# Patient Record
Sex: Male | Born: 1960 | Race: White | Hispanic: No | Marital: Single | Smoking: Current every day smoker
Health system: Southern US, Community
[De-identification: ages and names within clinical notes are randomized; demographics above are authoritative.]

## PROBLEM LIST (undated history)

## (undated) DIAGNOSIS — M549 Dorsalgia, unspecified: Secondary | ICD-10-CM

## (undated) DIAGNOSIS — G8929 Other chronic pain: Secondary | ICD-10-CM

## (undated) DIAGNOSIS — I10 Essential (primary) hypertension: Secondary | ICD-10-CM

---

## 2002-01-06 ENCOUNTER — Inpatient Hospital Stay (HOSPITAL_COMMUNITY): Admission: EM | Admit: 2002-01-06 | Discharge: 2002-01-14 | Payer: Self-pay | Admitting: Radiation Oncology

## 2009-10-30 ENCOUNTER — Emergency Department: Payer: Self-pay | Admitting: Emergency Medicine

## 2015-09-17 ENCOUNTER — Emergency Department (HOSPITAL_COMMUNITY): Payer: Commercial Managed Care - PPO

## 2015-09-17 ENCOUNTER — Encounter (HOSPITAL_COMMUNITY): Payer: Self-pay

## 2015-09-17 DIAGNOSIS — R079 Chest pain, unspecified: Secondary | ICD-10-CM | POA: Diagnosis present

## 2015-09-17 DIAGNOSIS — R112 Nausea with vomiting, unspecified: Secondary | ICD-10-CM | POA: Insufficient documentation

## 2015-09-17 DIAGNOSIS — R918 Other nonspecific abnormal finding of lung field: Secondary | ICD-10-CM | POA: Diagnosis not present

## 2015-09-17 DIAGNOSIS — I10 Essential (primary) hypertension: Secondary | ICD-10-CM | POA: Diagnosis not present

## 2015-09-17 DIAGNOSIS — J159 Unspecified bacterial pneumonia: Secondary | ICD-10-CM | POA: Diagnosis not present

## 2015-09-17 DIAGNOSIS — Z880 Allergy status to penicillin: Secondary | ICD-10-CM | POA: Diagnosis not present

## 2015-09-17 DIAGNOSIS — G8929 Other chronic pain: Secondary | ICD-10-CM | POA: Insufficient documentation

## 2015-09-17 DIAGNOSIS — F17210 Nicotine dependence, cigarettes, uncomplicated: Secondary | ICD-10-CM | POA: Diagnosis not present

## 2015-09-17 LAB — CBC
HCT: 43.5 % (ref 39.0–52.0)
Hemoglobin: 14.4 g/dL (ref 13.0–17.0)
MCH: 32 pg (ref 26.0–34.0)
MCHC: 33.1 g/dL (ref 30.0–36.0)
MCV: 96.7 fL (ref 78.0–100.0)
PLATELETS: 313 10*3/uL (ref 150–400)
RBC: 4.5 MIL/uL (ref 4.22–5.81)
RDW: 13.9 % (ref 11.5–15.5)
WBC: 12.5 10*3/uL — AB (ref 4.0–10.5)

## 2015-09-17 LAB — I-STAT TROPONIN, ED: TROPONIN I, POC: 0 ng/mL (ref 0.00–0.08)

## 2015-09-17 LAB — BASIC METABOLIC PANEL
ANION GAP: 13 (ref 5–15)
BUN: 21 mg/dL — AB (ref 6–20)
CALCIUM: 9.5 mg/dL (ref 8.9–10.3)
CO2: 33 mmol/L — ABNORMAL HIGH (ref 22–32)
CREATININE: 1.1 mg/dL (ref 0.61–1.24)
Chloride: 94 mmol/L — ABNORMAL LOW (ref 101–111)
Glucose, Bld: 107 mg/dL — ABNORMAL HIGH (ref 65–99)
Potassium: 3.5 mmol/L (ref 3.5–5.1)
Sodium: 140 mmol/L (ref 135–145)

## 2015-09-17 NOTE — ED Notes (Signed)
7 weeks ago pt had flu symptoms since then pt has been fatigued, substernal chest pain, pain underneath left breast and LUQ abd pain.  Pt has been vomiting off and on,last vomiting 3 days ago.

## 2015-09-18 ENCOUNTER — Emergency Department (HOSPITAL_COMMUNITY)
Admission: EM | Admit: 2015-09-18 | Discharge: 2015-09-18 | Disposition: A | Discharge status: Home / Self Care | Payer: Commercial Managed Care - PPO | Attending: Emergency Medicine | Admitting: Emergency Medicine

## 2015-09-18 DIAGNOSIS — J189 Pneumonia, unspecified organism: Secondary | ICD-10-CM

## 2015-09-18 DIAGNOSIS — R9389 Abnormal findings on diagnostic imaging of other specified body structures: Secondary | ICD-10-CM

## 2015-09-18 HISTORY — DX: Other chronic pain: G89.29

## 2015-09-18 HISTORY — DX: Essential (primary) hypertension: I10

## 2015-09-18 HISTORY — DX: Dorsalgia, unspecified: M54.9

## 2015-09-18 MED ORDER — ONDANSETRON 4 MG PO TBDP: 4.0000 mg | ORAL_TABLET | Freq: Three times a day (TID) | ORAL | Status: AC | PRN

## 2015-09-18 MED ORDER — LEVOFLOXACIN 750 MG PO TABS
750.0000 mg | ORAL_TABLET | Freq: Once | ORAL | Status: AC
  Administered 2015-09-18: 750 mg via ORAL
  Filled 2015-09-18: qty 1

## 2015-09-18 MED ORDER — LEVOFLOXACIN 500 MG PO TABS: 500.0000 mg | ORAL_TABLET | Freq: Every day | ORAL | Status: AC

## 2015-09-18 NOTE — ED Provider Notes (Signed)
CSN: [id number]     Arrival date & time 09/17/15  2251 History   By signing my name below, I, Bethel BornBritney McCollum, attest that this documentation has been prepared under the direction and in the presence of Rolland PorterMark Quamel Fitzmaurice, MD. Electronically Signed: Bethel BornBritney McCollum, ED Scribe. 09/18/2015. 3:52 AM   Chief Complaint  Patient presents with  . Chest Pain    The history is provided by the patient. No language interpreter was used.   Angel Simmondsaul Dwayne Mcglocklin is a 55 y.o. male with history of HTN who presents to the Emergency Department complaining of sharp, 5/10 in severity,  substernal chest pain with onset 7 weeks ago. Pt states that 7 weeks ago he had the flu with a cough, SOB, chest pain, fever, abdominal pain, nausea, and vomiting.  The chest pain and cough have lingered and he has continued to vomit over the last 3-4 weeks. He reports approximately 10 episodes of foamy, non-bloody emesis since initial onset. Associated symptoms include chills. He is a heavy drinker and is concerned abut his liver because his eyes are red. He last used alcohol 4 days ago. He smokes 1 PPD. Pt is allergic to penicillin.   Past Medical History  Diagnosis Date  . Hypertension   . Chronic back pain    History reviewed. No pertinent past surgical history. History reviewed. No pertinent family history. Social History  Substance Use Topics  . Smoking status: Current Every Day Smoker -- 1.00 packs/day    Types: Cigarettes  . Smokeless tobacco: None  . Alcohol Use: Yes    Review of Systems  Constitutional: Positive for chills. Negative for fever, diaphoresis, appetite change and fatigue.  HENT: Negative for mouth sores, sore throat and trouble swallowing.   Eyes: Positive for redness. Negative for visual disturbance.  Respiratory: Positive for cough. Negative for chest tightness, shortness of breath and wheezing.   Cardiovascular: Positive for chest pain.  Gastrointestinal: Positive for nausea and vomiting. Negative for  abdominal pain, diarrhea and abdominal distention.  Endocrine: Negative for polydipsia, polyphagia and polyuria.  Genitourinary: Negative for dysuria, frequency and hematuria.  Musculoskeletal: Negative for gait problem.  Skin: Negative for color change, pallor and rash.  Neurological: Negative for dizziness, syncope, light-headedness and headaches.  Hematological: Does not bruise/bleed easily.  Psychiatric/Behavioral: Negative for behavioral problems and confusion.      Allergies  Penicillins  Home Medications   Prior to Admission medications   Medication Sig Start Date End Date Taking? Authorizing Provider  levofloxacin (LEVAQUIN) 500 MG tablet Take 1 tablet (500 mg total) by mouth daily. 09/18/15   Rolland PorterMark Kervin Bones, MD  ondansetron (ZOFRAN ODT) 4 MG disintegrating tablet Take 1 tablet (4 mg total) by mouth every 8 (eight) hours as needed for nausea. 09/18/15   Rolland PorterMark Jailin Moomaw, MD   BP 137/74 mmHg  Pulse 62  Temp(Src) 97.8 F (36.6 C) (Oral)  Resp 16  Ht 5\' 11"  (1.803 m)  Wt 201 lb 9.6 oz (91.445 kg)  BMI 28.13 kg/m2  SpO2 99% Physical Exam  Constitutional: He is oriented to person, place, and time. He appears well-developed and well-nourished. No distress.  HENT:  Head: Normocephalic.  Eyes: Conjunctivae are normal. Pupils are equal, round, and reactive to light. No scleral icterus.  Neck: Normal range of motion. Neck supple. No thyromegaly present.  Cardiovascular: Normal rate and regular rhythm.  Exam reveals no gallop and no friction rub.   No murmur heard. Pulmonary/Chest: Effort normal. No respiratory distress. He has no wheezes. He has  rales.  Crackles at the right lateral mid lung   Abdominal: Soft. Bowel sounds are normal. He exhibits no distension. There is no tenderness. There is no rebound.  Musculoskeletal: Normal range of motion.  Neurological: He is alert and oriented to person, place, and time.  Skin: Skin is warm and dry. No rash noted.  Psychiatric: He has a normal  mood and affect. His behavior is normal.    ED Course  Procedures (including critical care time) DIAGNOSTIC STUDIES: Oxygen Saturation is 99% on RA,  normal by my interpretation.    COORDINATION OF CARE: 3:49 AM Discussed treatment plan which includes lab work, CXR, EKG, and abx with pt at bedside and pt agreed to plan.  Labs Review Labs Reviewed  BASIC METABOLIC PANEL - Abnormal; Notable for the following:    Chloride 94 (*)    CO2 33 (*)    Glucose, Bld 107 (*)    BUN 21 (*)    All other components within normal limits  CBC - Abnormal; Notable for the following:    WBC 12.5 (*)    All other components within normal limits  Rosezena Sensor, ED    Imaging Review Dg Chest 2 View  09/17/2015  CLINICAL DATA:  Left lower chest pain. Cough. Intermittent symptoms for 7 weeks. EXAM: CHEST  2 VIEW COMPARISON:  None. FINDINGS: Ill-defined opacities at the right lung base, not well seen on the lateral view, however likely localizing to the right middle lobe. Questionable nodular opacities in the left lung base. While 1 of these opacities Ing reflect nipple shadow, additional nodular density Difiore be a pulmonary nodule or bone island within the anterior rib. The heart size and mediastinal contours are normal. No pulmonary edema, pleural effusion or pneumothorax. There is degenerative change in the spine. IMPRESSION: Ill-defined right basilar opacities, likely localizing to the right middle lobe, Laster reflect pneumonia in the appropriate clinical setting. There are questionable pulmonary nodules in the left lung base. Followup PA and lateral chest X-ray is recommended in 3-4 weeks following trial of antibiotic therapy to ensure resolution and exclude underlying malignancy, should left sided nodular densities persist, chest CT Mahaffy be warranted. Electronically Signed   By: Rubye Oaks M.D.   On: 09/17/2015 23:49   I have personally reviewed and evaluated these images and lab results as part of my  medical decision-making.   EKG Interpretation None      MDM   Final diagnoses:  Community acquired pneumonia  Abnormal chest x-ray    Patient clinically has crackles suggestive of right middle to lower lobe pneumonia. He is not hypoxemic, tachycardic.  He does not appear distressed. Discussed with him the importance of follow-up chest x-ray to ensure all of the above changes have resolved. I suggested he follow-up with his primary care physician in 1 month's time. Pressures for Levaquin. First dose here. No alcohol. No tobacco.   I personally performed the services described in this documentation, which was scribed in my presence. The recorded information has been reviewed and is accurate.    Rolland Porter, MD 09/18/15 [phone]

## 2015-09-18 NOTE — ED Notes (Signed)
Pt verbalized understanding of discharge instructions and medication instructions. Vital signs stable at discharge.

## 2015-09-18 NOTE — Discharge Instructions (Signed)
Your x-ray is abnormal. You have a right middle lobe pneumonia. Call your physician to arrange for a follow-up appointment for a repeat x-ray to ensure that the pneumonia has resolved, and your x-ray has cleared   Community-Acquired Pneumonia, Adult Pneumonia is an infection of the lungs. There are different types of pneumonia. One type can develop while a person is in a hospital. A different type, called community-acquired pneumonia, develops in people who are not, or have not recently been, in the hospital or other health care facility.  CAUSES Pneumonia Meenach be caused by bacteria, viruses, or funguses. Community-acquired pneumonia is often caused by Streptococcus pneumonia bacteria. These bacteria are often passed from one person to another by breathing in droplets from the cough or sneeze of an infected person. RISK FACTORS The condition is more likely to develop in:  People who havechronic diseases, such as chronic obstructive pulmonary disease (COPD), asthma, congestive heart failure, cystic fibrosis, diabetes, or kidney disease.  People who haveearly-stage or late-stage HIV.  People who havesickle cell disease.  People who havehad their spleen removed (splenectomy).  People who havepoor Administratordental hygiene.  People who havemedical conditions that increase the risk of breathing in (aspirating) secretions their own mouth and nose.   People who havea weakened immune system (immunocompromised).  People who smoke.  People whotravel to areas where pneumonia-causing germs commonly exist.  People whoare around animal habitats or animals that have pneumonia-causing germs, including birds, bats, rabbits, cats, and farm animals. SYMPTOMS Symptoms of this condition include:  Adry cough.  A wet (productive) cough.  Fever.  Sweating.  Chest pain, especially when breathing deeply or coughing.  Rapid breathing or difficulty breathing.  Shortness of breath.  Shaking  chills.  Fatigue.  Muscle aches. DIAGNOSIS Your health care provider will take a medical history and perform a physical exam. You Golberg also have other tests, including:  Imaging studies of your chest, including X-rays.  Tests to check your blood oxygen level and other blood gases.  Other tests on blood, mucus (sputum), fluid around your lungs (pleural fluid), and urine. If your pneumonia is severe, other tests Mauzy be done to identify the specific cause of your illness. TREATMENT The type of treatment that you receive depends on many factors, such as the cause of your pneumonia, the medicines you take, and other medical conditions that you have. For most adults, treatment and recovery from pneumonia Derner occur at home. In some cases, treatment must happen in a hospital. Treatment Raker include:  Antibiotic medicines, if the pneumonia was caused by bacteria.  Antiviral medicines, if the pneumonia was caused by a virus.  Medicines that are given by mouth or through an IV tube.  Oxygen.  Respiratory therapy. Although rare, treating severe pneumonia Whitebread include:  Mechanical ventilation. This is done if you are not breathing well on your own and you cannot maintain a safe blood oxygen level.  Thoracentesis. This procedureremoves fluid around one lung or both lungs to help you breathe better. HOME CARE INSTRUCTIONS  Take over-the-counter and prescription medicines only as told by your health care provider.  Only takecough medicine if you are losing sleep. Understand that cough medicine can prevent your body's natural ability to remove mucus from your lungs.  If you were prescribed an antibiotic medicine, take it as told by your health care provider. Do not stop taking the antibiotic even if you start to feel better.  Sleep in a semi-upright position at night. Try sleeping in a reclining chair,  or place a few pillows under your head.  Do not use tobacco products, including cigarettes,  chewing tobacco, and e-cigarettes. If you need help quitting, ask your health care provider.  Drink enough water to keep your urine clear or pale yellow. This will help to thin out mucus secretions in your lungs. PREVENTION There are ways that you can decrease your risk of developing community-acquired pneumonia. Consider getting a pneumococcal vaccine if:  You are older than 55 years of age.  You are older than 55 years of age and are undergoing cancer treatment, have chronic lung disease, or have other medical conditions that affect your immune system. Ask your health care provider if this applies to you. There are different types and schedules of pneumococcal vaccines. Ask your health care provider which vaccination option is best for you. You Nicks also prevent community-acquired pneumonia if you take these actions:  Get an influenza vaccine every year. Ask your health care provider which type of influenza vaccine is best for you.  Go to the dentist on a regular basis.  Wash your hands often. Use hand sanitizer if soap and water are not available. SEEK MEDICAL CARE IF:  You have a fever.  You are losing sleep because you cannot control your cough with cough medicine. SEEK IMMEDIATE MEDICAL CARE IF:  You have worsening shortness of breath.  You have increased chest pain.  Your sickness becomes worse, especially if you are an older adult or have a weakened immune system.  You cough up blood.   This information is not intended to replace advice given to you by your health care provider. Make sure you discuss any questions you have with your health care provider.   Document Released: 06/01/2005 Document Revised: 02/20/2015 Document Reviewed: 09/26/2014 Elsevier Interactive Patient Education [employer]

## 2015-09-18 NOTE — ED Notes (Signed)
Family at bedside. 

## 2017-01-17 IMAGING — CR DG CHEST 2V
2 series · 2 of 2 positions shown · non-contrast
Comparison: None.

CLINICAL DATA: Left lower chest pain. Cough. Intermittent symptoms
for 7 weeks.

EXAM:
CHEST  2 VIEW

[chest pa]
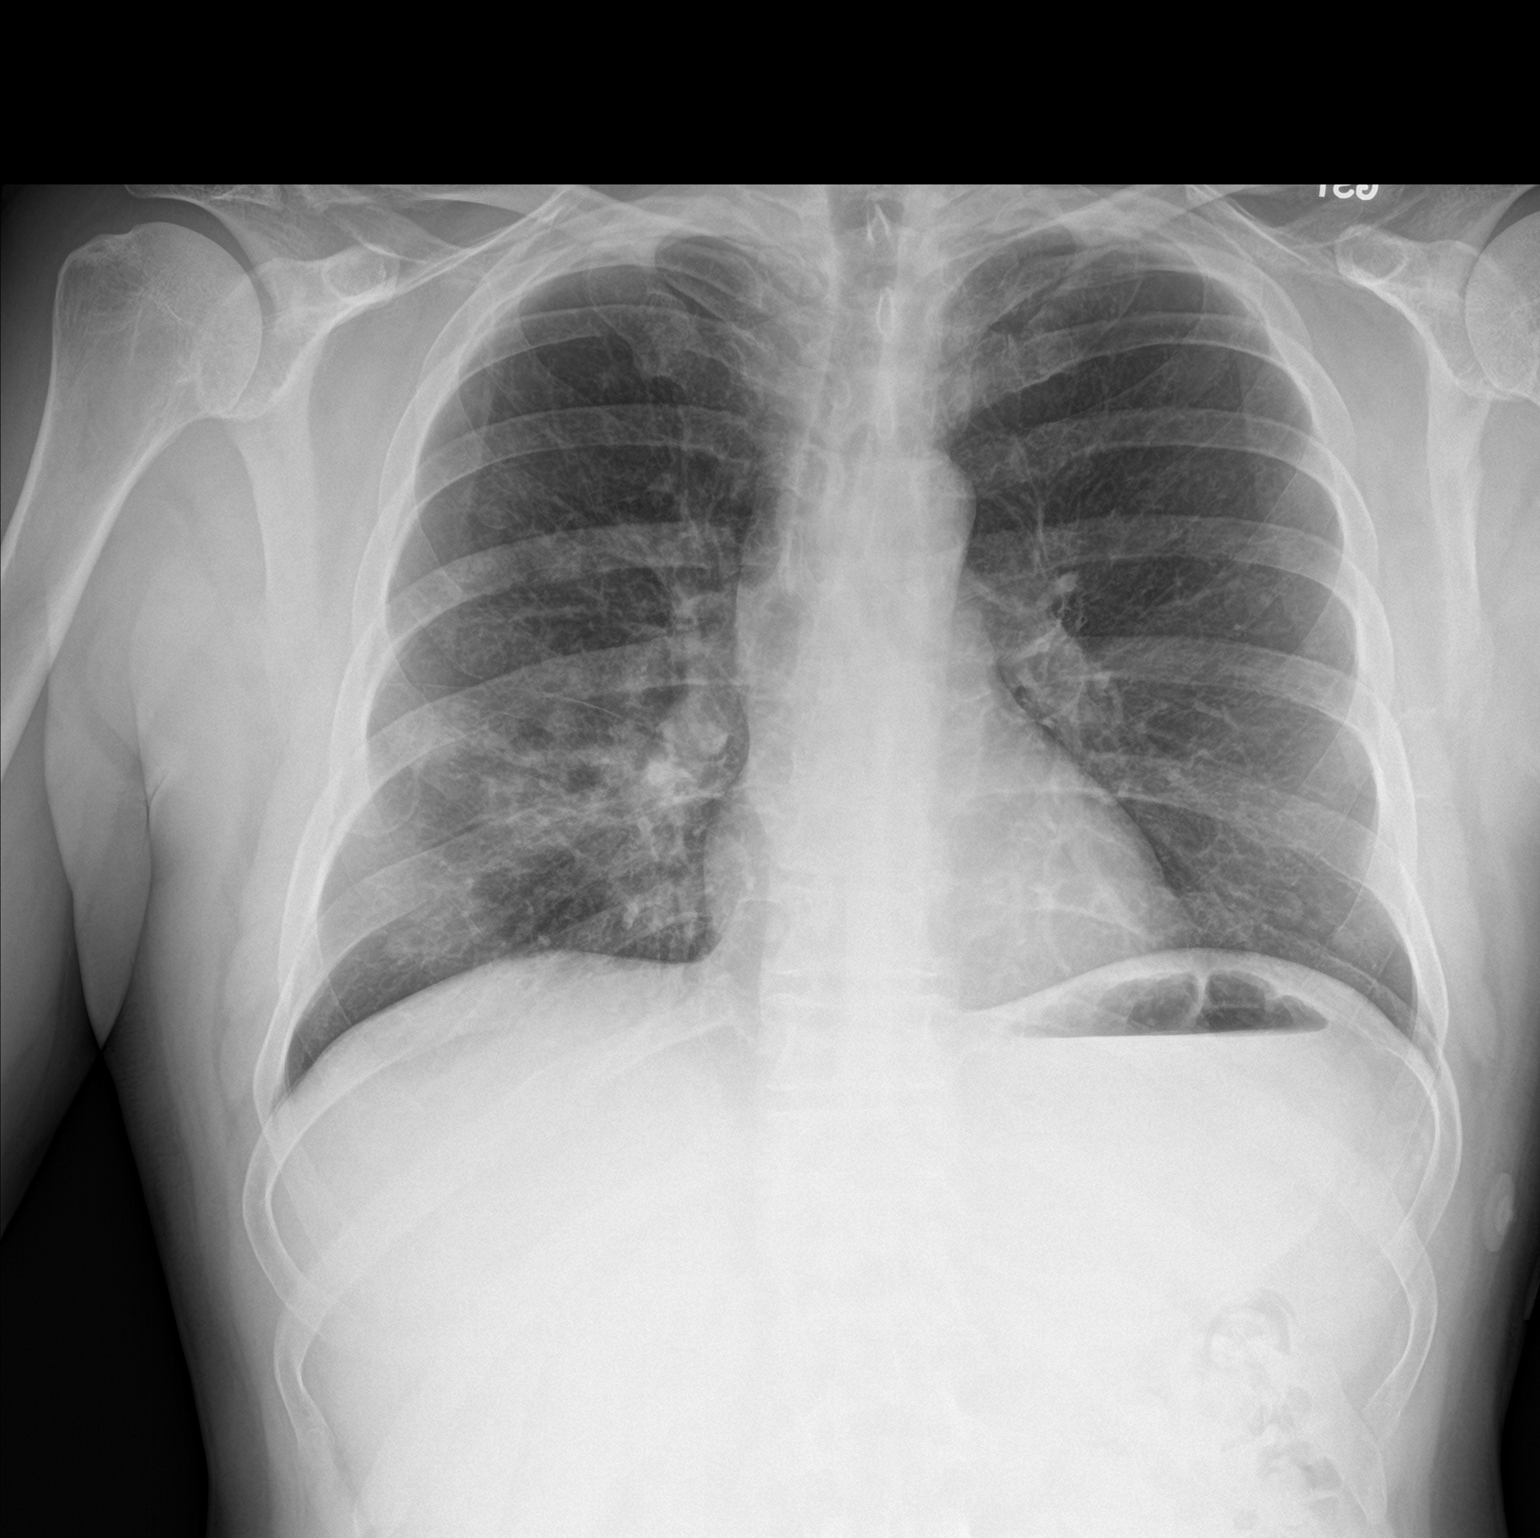

[chest lat]
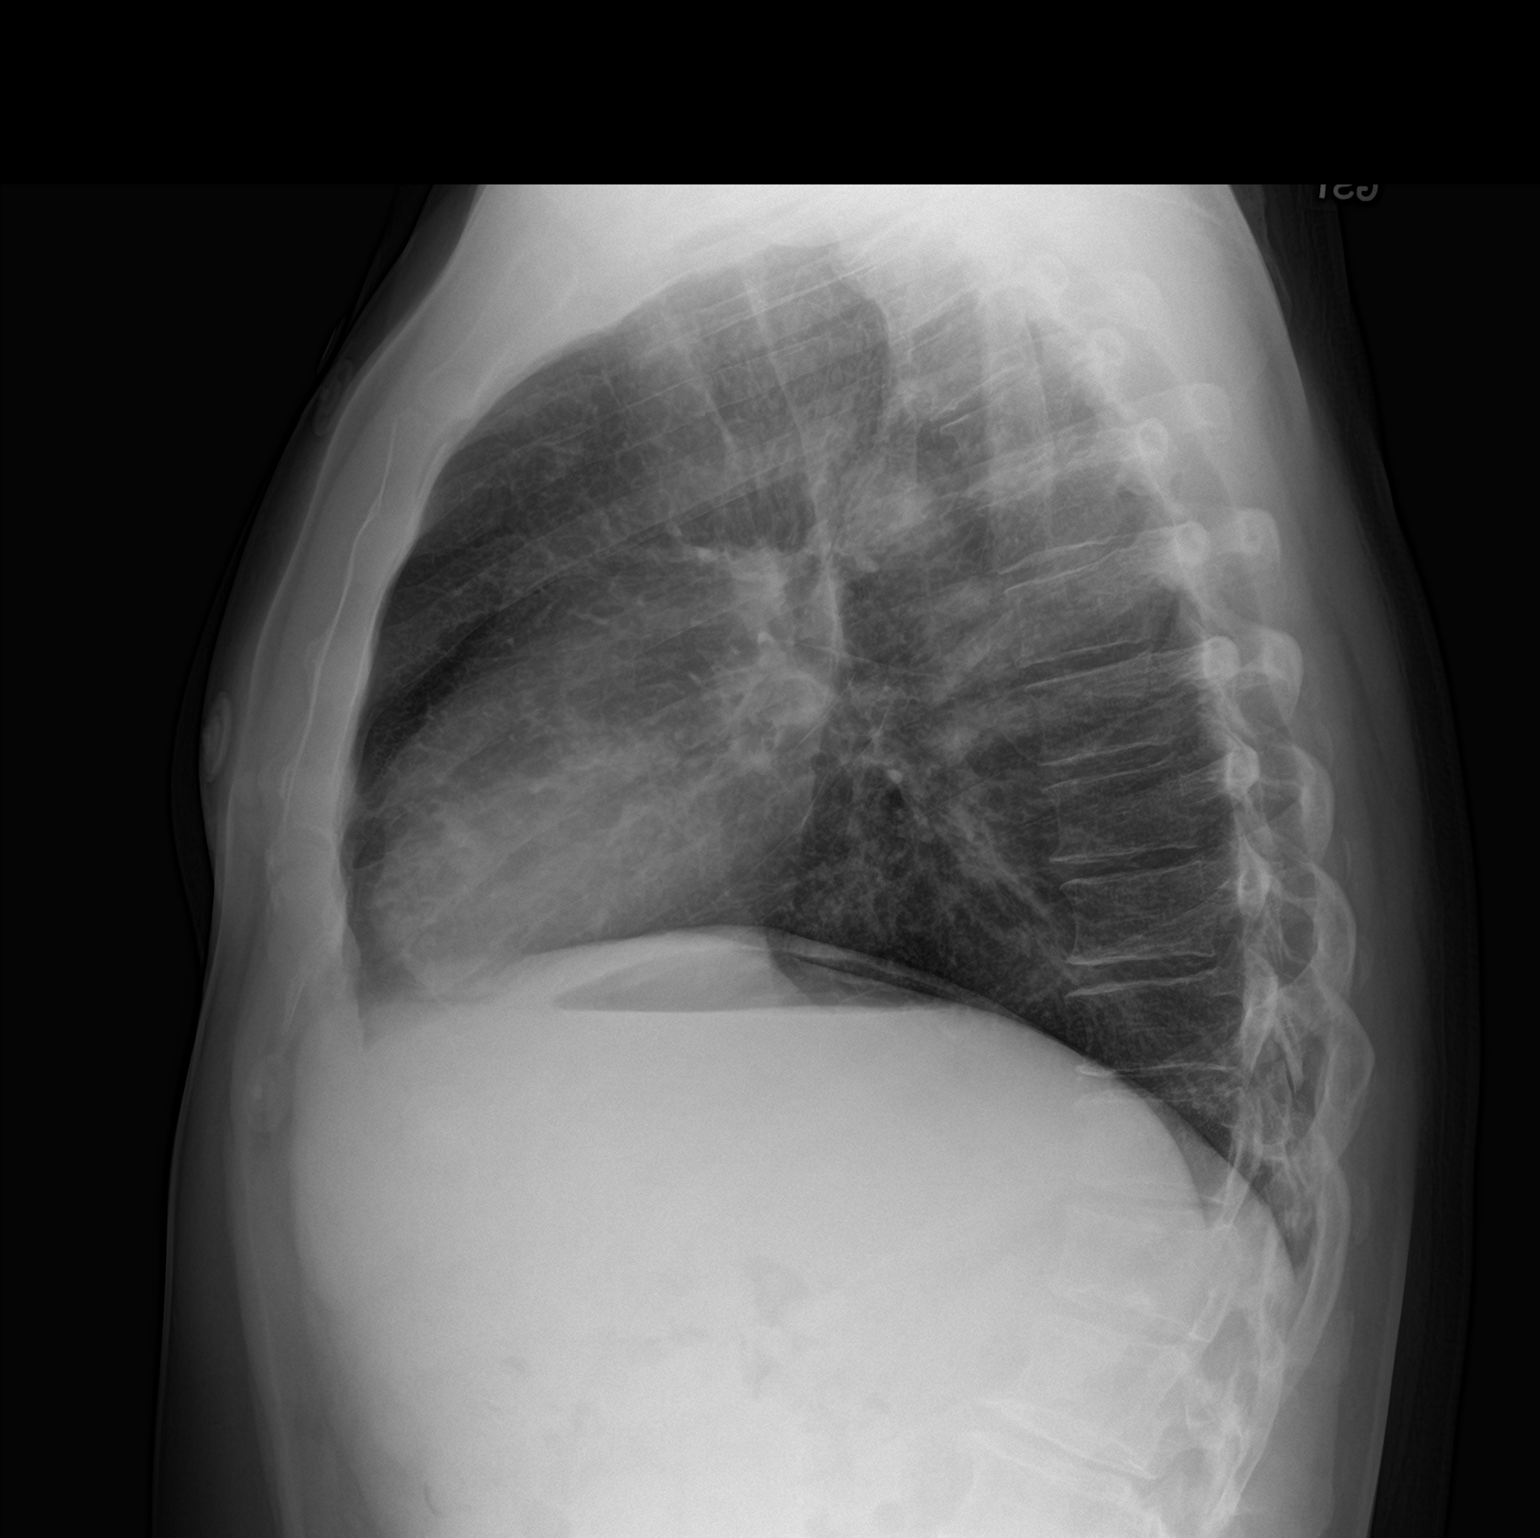

[2 of 2 positions shown; findings below may reference images not displayed]

FINDINGS: Ill-defined opacities at the right lung base, not well seen on the
lateral view, however likely localizing to the right middle lobe.
Questionable nodular opacities in the left lung base. While 1 of
these opacities may reflect nipple shadow, additional nodular
density may be a pulmonary nodule or bone island within the anterior
rib. The heart size and mediastinal contours are normal. No
pulmonary edema, pleural effusion or pneumothorax. There is
degenerative change in the spine.
IMPRESSION: Ill-defined right basilar opacities, likely localizing to the right
middle lobe, may reflect pneumonia in the appropriate clinical
setting. There are questionable pulmonary nodules in the left lung
base. Followup PA and lateral chest X-ray is recommended in 3-4
weeks following trial of antibiotic therapy to ensure resolution and
exclude underlying malignancy, should left sided nodular densities
persist, chest CT may be warranted.

## 2018-04-15 DEATH — deceased
